# Patient Record
Sex: Male | Born: 1995 | Race: White | Hispanic: No | Marital: Single
Health system: Southern US, Community
[De-identification: ages and names within clinical notes are randomized; demographics above are authoritative.]

## PROBLEM LIST (undated history)

## (undated) DIAGNOSIS — R51 Headache: Secondary | ICD-10-CM

## (undated) HISTORY — DX: Headache: R51

---
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
## 2017-01-07 MED FILL — OMEPRAZOLE DR 40 MG CAPSULE: 40 | 30 days supply | Qty: 30 | Fill #0

## 2017-01-16 DIAGNOSIS — F419 Anxiety disorder, unspecified: Secondary | ICD-10-CM | POA: Diagnosis not present

## 2017-01-16 DIAGNOSIS — Z0000 Encounter for general adult medical examination without abnormal findings: Secondary | ICD-10-CM | POA: Diagnosis not present

## 2017-01-16 DIAGNOSIS — E559 Vitamin D deficiency, unspecified: Secondary | ICD-10-CM | POA: Diagnosis not present

## 2017-01-16 DIAGNOSIS — R5383 Other fatigue: Secondary | ICD-10-CM | POA: Diagnosis not present

## 2017-01-16 DIAGNOSIS — Z114 Encounter for screening for human immunodeficiency virus [HIV]: Secondary | ICD-10-CM | POA: Diagnosis not present

## 2017-01-16 MED FILL — hydrOXYzine HCL 25 MG TABS: 25 | 30 days supply | Qty: 30 | Fill #0

## 2017-01-30 DIAGNOSIS — E559 Vitamin D deficiency, unspecified: Secondary | ICD-10-CM | POA: Diagnosis not present

## 2017-01-30 DIAGNOSIS — F419 Anxiety disorder, unspecified: Secondary | ICD-10-CM | POA: Diagnosis not present

## 2017-02-05 MED FILL — OMEPRAZOLE DR 40 MG CAPSULE: 40 | 30 days supply | Qty: 30 | Fill #1

## 2017-03-12 MED FILL — OMEPRAZOLE DR 40 MG CAPSULE: 40 | 30 days supply | Qty: 30 | Fill #2

## 2017-04-20 MED FILL — OMEPRAZOLE DR 40 MG CAPSULE: 40 | 30 days supply | Qty: 30 | Fill #3

## 2017-04-25 DIAGNOSIS — S29019A Strain of muscle and tendon of unspecified wall of thorax, initial encounter: Secondary | ICD-10-CM | POA: Diagnosis not present

## 2017-04-25 DIAGNOSIS — M546 Pain in thoracic spine: Secondary | ICD-10-CM | POA: Diagnosis not present

## 2017-04-25 DIAGNOSIS — S161XXA Strain of muscle, fascia and tendon at neck level, initial encounter: Secondary | ICD-10-CM | POA: Diagnosis not present

## 2017-04-25 DIAGNOSIS — M542 Cervicalgia: Secondary | ICD-10-CM | POA: Diagnosis not present

## 2017-05-06 ENCOUNTER — Encounter (INDEPENDENT_AMBULATORY_CARE_PROVIDER_SITE_OTHER): Payer: Self-pay | Admitting: Neurology

## 2017-05-06 DIAGNOSIS — S29019D Strain of muscle and tendon of unspecified wall of thorax, subsequent encounter: Secondary | ICD-10-CM | POA: Diagnosis not present

## 2017-05-06 DIAGNOSIS — J018 Other acute sinusitis: Secondary | ICD-10-CM | POA: Diagnosis not present

## 2017-06-03 MED FILL — OMEPRAZOLE DR 40 MG CAPSULE: 40 | 30 days supply | Qty: 30 | Fill #4

## 2018-06-07 DIAGNOSIS — J069 Acute upper respiratory infection, unspecified: Secondary | ICD-10-CM | POA: Diagnosis not present

## 2018-06-07 DIAGNOSIS — R52 Pain, unspecified: Secondary | ICD-10-CM | POA: Diagnosis not present

## 2018-06-07 DIAGNOSIS — J029 Acute pharyngitis, unspecified: Secondary | ICD-10-CM | POA: Diagnosis not present

## 2018-09-15 DIAGNOSIS — M542 Cervicalgia: Secondary | ICD-10-CM | POA: Diagnosis not present

## 2018-09-15 DIAGNOSIS — M9902 Segmental and somatic dysfunction of thoracic region: Secondary | ICD-10-CM | POA: Diagnosis not present

## 2018-09-15 DIAGNOSIS — M545 Low back pain: Secondary | ICD-10-CM | POA: Diagnosis not present

## 2018-09-15 DIAGNOSIS — M9901 Segmental and somatic dysfunction of cervical region: Secondary | ICD-10-CM | POA: Diagnosis not present

## 2018-09-16 DIAGNOSIS — M9901 Segmental and somatic dysfunction of cervical region: Secondary | ICD-10-CM | POA: Diagnosis not present

## 2018-09-16 DIAGNOSIS — M542 Cervicalgia: Secondary | ICD-10-CM | POA: Diagnosis not present

## 2018-09-16 DIAGNOSIS — M9902 Segmental and somatic dysfunction of thoracic region: Secondary | ICD-10-CM | POA: Diagnosis not present

## 2018-09-16 DIAGNOSIS — M545 Low back pain: Secondary | ICD-10-CM | POA: Diagnosis not present

## 2018-10-01 DIAGNOSIS — Z23 Encounter for immunization: Secondary | ICD-10-CM | POA: Diagnosis not present

## 2018-10-01 DIAGNOSIS — G43109 Migraine with aura, not intractable, without status migrainosus: Secondary | ICD-10-CM | POA: Diagnosis not present

## 2018-10-01 DIAGNOSIS — F17210 Nicotine dependence, cigarettes, uncomplicated: Secondary | ICD-10-CM | POA: Diagnosis not present

## 2018-10-01 DIAGNOSIS — Z0000 Encounter for general adult medical examination without abnormal findings: Secondary | ICD-10-CM | POA: Diagnosis not present

## 2018-10-30 DIAGNOSIS — R05 Cough: Secondary | ICD-10-CM | POA: Diagnosis not present

## 2018-10-30 DIAGNOSIS — J209 Acute bronchitis, unspecified: Secondary | ICD-10-CM | POA: Diagnosis not present

## 2018-10-30 DIAGNOSIS — R6883 Chills (without fever): Secondary | ICD-10-CM | POA: Diagnosis not present

## 2018-10-30 DIAGNOSIS — J029 Acute pharyngitis, unspecified: Secondary | ICD-10-CM | POA: Diagnosis not present

## 2018-12-20 DIAGNOSIS — M545 Low back pain: Secondary | ICD-10-CM | POA: Diagnosis not present

## 2018-12-20 DIAGNOSIS — M9903 Segmental and somatic dysfunction of lumbar region: Secondary | ICD-10-CM | POA: Diagnosis not present

## 2018-12-20 DIAGNOSIS — M542 Cervicalgia: Secondary | ICD-10-CM | POA: Diagnosis not present

## 2018-12-20 DIAGNOSIS — M546 Pain in thoracic spine: Secondary | ICD-10-CM | POA: Diagnosis not present

## 2018-12-21 DIAGNOSIS — M546 Pain in thoracic spine: Secondary | ICD-10-CM | POA: Diagnosis not present

## 2018-12-21 DIAGNOSIS — M542 Cervicalgia: Secondary | ICD-10-CM | POA: Diagnosis not present

## 2018-12-21 DIAGNOSIS — M9903 Segmental and somatic dysfunction of lumbar region: Secondary | ICD-10-CM | POA: Diagnosis not present

## 2018-12-21 DIAGNOSIS — M545 Low back pain: Secondary | ICD-10-CM | POA: Diagnosis not present

## 2019-10-12 ENCOUNTER — Other Ambulatory Visit: Payer: Self-pay | Admitting: Geriatric Medicine

## 2019-10-12 ENCOUNTER — Ambulatory Visit
Admission: RE | Admit: 2019-10-12 | Discharge: 2019-10-12 | Disposition: A | Discharge status: Home / Self Care | Payer: 59 | Source: Ambulatory Visit | Attending: Geriatric Medicine | Admitting: Geriatric Medicine

## 2019-10-12 DIAGNOSIS — R0600 Dyspnea, unspecified: Secondary | ICD-10-CM | POA: Diagnosis not present

## 2019-10-12 DIAGNOSIS — Z0000 Encounter for general adult medical examination without abnormal findings: Secondary | ICD-10-CM | POA: Diagnosis not present

## 2019-10-12 DIAGNOSIS — G43109 Migraine with aura, not intractable, without status migrainosus: Secondary | ICD-10-CM | POA: Diagnosis not present

## 2019-10-12 DIAGNOSIS — R0609 Other forms of dyspnea: Secondary | ICD-10-CM | POA: Diagnosis not present

## 2019-10-12 DIAGNOSIS — D7589 Other specified diseases of blood and blood-forming organs: Secondary | ICD-10-CM | POA: Diagnosis not present

## 2019-10-12 DIAGNOSIS — Z79899 Other long term (current) drug therapy: Secondary | ICD-10-CM | POA: Diagnosis not present

## 2019-10-12 DIAGNOSIS — Z23 Encounter for immunization: Secondary | ICD-10-CM | POA: Diagnosis not present

## 2019-10-12 DIAGNOSIS — F17210 Nicotine dependence, cigarettes, uncomplicated: Secondary | ICD-10-CM | POA: Diagnosis not present

## 2019-10-12 DIAGNOSIS — Z13220 Encounter for screening for lipoid disorders: Secondary | ICD-10-CM | POA: Diagnosis not present

## 2019-10-12 DIAGNOSIS — Z789 Other specified health status: Secondary | ICD-10-CM | POA: Diagnosis not present

## 2019-11-02 DIAGNOSIS — M9902 Segmental and somatic dysfunction of thoracic region: Secondary | ICD-10-CM | POA: Diagnosis not present

## 2019-11-02 DIAGNOSIS — M546 Pain in thoracic spine: Secondary | ICD-10-CM | POA: Diagnosis not present

## 2019-11-02 DIAGNOSIS — M62838 Other muscle spasm: Secondary | ICD-10-CM | POA: Diagnosis not present

## 2019-11-03 DIAGNOSIS — M9906 Segmental and somatic dysfunction of lower extremity: Secondary | ICD-10-CM | POA: Diagnosis not present

## 2019-11-03 DIAGNOSIS — M9903 Segmental and somatic dysfunction of lumbar region: Secondary | ICD-10-CM | POA: Diagnosis not present

## 2019-11-03 DIAGNOSIS — M62838 Other muscle spasm: Secondary | ICD-10-CM | POA: Diagnosis not present

## 2019-11-03 DIAGNOSIS — M546 Pain in thoracic spine: Secondary | ICD-10-CM | POA: Diagnosis not present

## 2019-12-31 ENCOUNTER — Ambulatory Visit: Payer: 59 | Attending: Internal Medicine

## 2019-12-31 DIAGNOSIS — Z23 Encounter for immunization: Secondary | ICD-10-CM

## 2019-12-31 NOTE — Progress Notes (Signed)
   Covid-19 Vaccination Clinic  Name:  Cole [name]    MRN: [id number] DOB: May 26, 1996  12/31/2019  Mr. Cole [name] was observed post Covid-19 immunization for 15 minutes without incident. He was provided with Vaccine Information Sheet and instruction to access the V-Safe system.   Mr. Cole [name] was instructed to call 911 with any severe reactions post vaccine: Marland Kitchen Difficulty breathing  . Swelling of face and throat  . A fast heartbeat  . A bad rash all over body  . Dizziness and weakness   Immunizations Administered    Name Date Dose VIS Date Route   Pfizer COVID-19 Vaccine 12/31/2019 12:05 PM 0.3 mL 09/09/2019 Intramuscular   Manufacturer: [employer], [employer]   Lot: [id number]   NDC: [address]-2

## 2020-01-12 DIAGNOSIS — Z1152 Encounter for screening for COVID-19: Secondary | ICD-10-CM | POA: Diagnosis not present

## 2020-01-12 DIAGNOSIS — R05 Cough: Secondary | ICD-10-CM | POA: Diagnosis not present

## 2020-01-25 ENCOUNTER — Ambulatory Visit: Payer: 59 | Attending: Internal Medicine

## 2020-01-25 DIAGNOSIS — Z23 Encounter for immunization: Secondary | ICD-10-CM

## 2020-01-25 NOTE — Progress Notes (Signed)
   Covid-19 Vaccination Clinic  Name:  Cole [name]    MRN: [id number] DOB: 09-Mar-1996  01/25/2020  Cole [name] was observed post Covid-19 immunization for 15 minutes without incident. He was provided with Vaccine Information Sheet and instruction to access the V-Safe system.   Cole [name] was instructed to call 911 with any severe reactions post vaccine: Marland Kitchen Difficulty breathing  . Swelling of face and throat  . A fast heartbeat  . A bad rash all over body  . Dizziness and weakness   Immunizations Administered    Name Date Dose VIS Date Route   Pfizer COVID-19 Vaccine 01/25/2020  8:51 AM 0.3 mL 11/23/2018 Intramuscular   Manufacturer: [employer], [employer]   Lot: [id number]   NDC: [address]-9

## 2020-05-05 DIAGNOSIS — R4020 Unspecified coma: Secondary | ICD-10-CM | POA: Diagnosis not present

## 2020-05-05 DIAGNOSIS — R456 Violent behavior: Secondary | ICD-10-CM | POA: Diagnosis not present

## 2020-05-05 DIAGNOSIS — F29 Unspecified psychosis not due to a substance or known physiological condition: Secondary | ICD-10-CM | POA: Diagnosis not present

## 2020-05-05 DIAGNOSIS — F10929 Alcohol use, unspecified with intoxication, unspecified: Secondary | ICD-10-CM | POA: Diagnosis not present

## 2020-05-05 DIAGNOSIS — R000 Tachycardia, unspecified: Secondary | ICD-10-CM | POA: Diagnosis not present

## 2020-05-06 ENCOUNTER — Other Ambulatory Visit: Payer: Self-pay

## 2020-05-06 ENCOUNTER — Emergency Department (HOSPITAL_COMMUNITY)
Admission: EM | Admit: 2020-05-06 | Discharge: 2020-05-06 | Disposition: A | Discharge status: Home / Self Care | Payer: 59 | Attending: Emergency Medicine | Admitting: Emergency Medicine

## 2020-05-06 ENCOUNTER — Emergency Department (HOSPITAL_COMMUNITY)
Admission: EM | Admit: 2020-05-06 | Discharge: 2020-05-06 | Disposition: A | Discharge status: Home / Self Care | Payer: 59 | Source: Home / Self Care

## 2020-05-06 ENCOUNTER — Emergency Department (HOSPITAL_COMMUNITY): Payer: 59

## 2020-05-06 DIAGNOSIS — Y92410 Unspecified street and highway as the place of occurrence of the external cause: Secondary | ICD-10-CM | POA: Diagnosis not present

## 2020-05-06 DIAGNOSIS — W19XXXA Unspecified fall, initial encounter: Secondary | ICD-10-CM | POA: Diagnosis not present

## 2020-05-06 DIAGNOSIS — R4182 Altered mental status, unspecified: Secondary | ICD-10-CM | POA: Diagnosis not present

## 2020-05-06 DIAGNOSIS — S060X9A Concussion with loss of consciousness of unspecified duration, initial encounter: Secondary | ICD-10-CM

## 2020-05-06 DIAGNOSIS — S00211A Abrasion of right eyelid and periocular area, initial encounter: Secondary | ICD-10-CM | POA: Insufficient documentation

## 2020-05-06 DIAGNOSIS — Y999 Unspecified external cause status: Secondary | ICD-10-CM | POA: Insufficient documentation

## 2020-05-06 DIAGNOSIS — F10120 Alcohol abuse with intoxication, uncomplicated: Secondary | ICD-10-CM | POA: Diagnosis not present

## 2020-05-06 DIAGNOSIS — Y939 Activity, unspecified: Secondary | ICD-10-CM | POA: Insufficient documentation

## 2020-05-06 DIAGNOSIS — F10920 Alcohol use, unspecified with intoxication, uncomplicated: Secondary | ICD-10-CM

## 2020-05-06 DIAGNOSIS — S060X1A Concussion with loss of consciousness of 30 minutes or less, initial encounter: Secondary | ICD-10-CM | POA: Insufficient documentation

## 2020-05-06 DIAGNOSIS — F10129 Alcohol abuse with intoxication, unspecified: Secondary | ICD-10-CM | POA: Insufficient documentation

## 2020-05-06 LAB — ETHANOL: Alcohol, Ethyl (B): 336 mg/dL (ref <10)

## 2020-05-06 LAB — RAPID URINE DRUG SCREEN, HOSP PERFORMED
Amphetamines: NOT DETECTED
Barbiturates: NOT DETECTED
Benzodiazepines: NOT DETECTED
Cocaine: NOT DETECTED
Opiates: NOT DETECTED
Tetrahydrocannabinol: POSITIVE — AB

## 2020-05-06 LAB — CBC
HCT: 46.1 % (ref 39.0–52.0)
Hemoglobin: 15.4 g/dL (ref 13.0–17.0)
MCH: 32.1 pg (ref 26.0–34.0)
MCHC: 33.4 g/dL (ref 30.0–36.0)
MCV: 96 fL (ref 80.0–100.0)
Platelets: 298 10*3/uL (ref 150–400)
RBC: 4.8 MIL/uL (ref 4.22–5.81)
RDW: 11.7 % (ref 11.5–15.5)
WBC: 5.8 10*3/uL (ref 4.0–10.5)
nRBC: 0 % (ref 0.0–0.2)

## 2020-05-06 LAB — COMPREHENSIVE METABOLIC PANEL
ALT: 16 U/L (ref 0–44)
AST: 22 U/L (ref 15–41)
Albumin: 4.4 g/dL (ref 3.5–5.0)
Alkaline Phosphatase: 49 U/L (ref 38–126)
Anion gap: 11 (ref 5–15)
BUN: 11 mg/dL (ref 6–20)
CO2: 25 mmol/L (ref 22–32)
Calcium: 9 mg/dL (ref 8.9–10.3)
Chloride: 107 mmol/L (ref 98–111)
Creatinine, Ser: 0.84 mg/dL (ref 0.61–1.24)
GFR calc Af Amer: >60 mL/min (ref >60)
GFR calc non Af Amer: >60 mL/min (ref >60)
Glucose, Bld: 109 mg/dL — ABNORMAL HIGH (ref 70–99)
Potassium: 3.9 mmol/L (ref 3.5–5.1)
Sodium: 143 mmol/L (ref 135–145)
Total Bilirubin: 0.6 mg/dL (ref 0.3–1.2)
Total Protein: 7 g/dL (ref 6.5–8.1)

## 2020-05-06 MED ORDER — TETANUS-DIPHTH-ACELL PERTUSSIS 5-2.5-18.5 LF-MCG/0.5 IM SUSP: 0.5000 mL | Freq: Once | INTRAMUSCULAR | Status: DC

## 2020-05-06 NOTE — ED Provider Notes (Signed)
[facility] EMERGENCY DEPARTMENT Provider Note   CSN: [id number] Arrival date & time: 05/06/20  0028     History Chief Complaint  Patient presents with  . Fall   Level 5 caveat due to acuity of condition Cole [name] is a 24 y.o. male.  The history is provided by the patient and the EMS personnel. The history is limited by the condition of the patient.  Fall This is a new problem. The problem occurs constantly. Nothing aggravates the symptoms. Nothing relieves the symptoms.  Patient presents via EMS.  Patient was found near a bar lying on the ground.  EMS was called patient stood up and fell over.  He admits to heavy alcohol use.  No other details known arrival     PMH-unknown Soc hx - unknown Social History   Tobacco Use  . Smoking status: Not on file  Substance Use Topics  . Alcohol use: Not on file  . Drug use: Not on file    Home Medications Prior to Admission medications   Not on File    Allergies    Patient has no allergy information on record.  Review of Systems   Review of Systems  Unable to perform ROS: Acuity of condition    Physical Exam Updated Vital Signs BP 112/79 (BP Location: Left Arm)   Pulse 88   Resp (!) 22   Ht 1.676 m (5\' 6" )   Wt 54.4 kg   SpO2 99%   BMI 19.37 kg/m   Physical Exam CONSTITUTIONAL: Disheveled and anxious HEAD: Small abrasion above right eyebrow.  No other signs of trauma EYES: EOMI/PERRL ENMT: Mucous membranes moist, no visible trauma NECK: Patient refuses cervical collar SPINE/BACK:entire spine nontender, no bruising/crepitance/stepoffs noted to spine CV: S1/S2 noted, no murmurs/rubs/gallops noted LUNGS: Lungs are clear to auscultation bilaterally, no apparent distress ABDOMEN: soft, nontender, no rebound or guarding, bowel sounds noted throughout abdomen GU:no cva tenderness NEURO: Pt is awake/alert moves all extremitiesx4.  No facial droop.  Patient is intermittently combative and  agitated EXTREMITIES: pulses normal/equal, full ROM, pelvis stable, all other extremities/joints palpated/ranged and nontender SKIN: warm, color normal PSYCH: Anxious   ED Results / Procedures / Treatments   Labs (all labs ordered are listed, but only abnormal results are displayed) Labs Reviewed  COMPREHENSIVE METABOLIC PANEL - Abnormal; Notable for the following components:      Result Value   Glucose, Bld 109 (*)    All other components within normal limits  ETHANOL - Abnormal; Notable for the following components:   Alcohol, Ethyl (B) 336 (*)    All other components within normal limits  RAPID URINE DRUG SCREEN, HOSP PERFORMED - Abnormal; Notable for the following components:   Tetrahydrocannabinol POSITIVE (*)    All other components within normal limits  CBC    EKG None  Radiology CT HEAD WO CONTRAST  Result Date: 05/06/2020 CLINICAL DATA:  Change in mental status EXAM: CT HEAD WITHOUT CONTRAST TECHNIQUE: Contiguous axial images were obtained from the base of the skull through the vertex without intravenous contrast. COMPARISON:  None. FINDINGS: Brain: No evidence of acute territorial infarction, hemorrhage, hydrocephalus,extra-axial collection or mass lesion/mass effect. Normal gray-white differentiation. Ventricles are normal in size and contour. Vascular: No hyperdense vessel or unexpected calcification. Skull: The skull is intact. No fracture or focal lesion identified. Sinuses/Orbits: Ethmoid air cell mucosal thickening is seen. Mastoid air cells are clear. The orbits and globes intact. Other: None Cervical spine: Alignment: Physiologic Skull base and  vertebrae: Visualized skull base is intact. No atlanto-occipital dissociation. The vertebral body heights are well maintained. No fracture or pathologic osseous lesion seen. Soft tissues and spinal canal: The visualized paraspinal soft tissues are unremarkable. No prevertebral soft tissue swelling is seen. The spinal canal is  grossly unremarkable, no large epidural collection or significant canal narrowing. Disc levels:  No significant canal or neural foraminal narrowing. Upper chest: The lung apices are clear. Thoracic inlet is within normal limits. Other: None IMPRESSION: No acute intracranial abnormality. No acute fracture or malalignment of the spine. Electronically Signed   By: Jonna Clark M.D.   On: 05/06/2020 01:11   CT CERVICAL SPINE WO CONTRAST  Result Date: 05/06/2020 CLINICAL DATA:  Change in mental status EXAM: CT HEAD WITHOUT CONTRAST TECHNIQUE: Contiguous axial images were obtained from the base of the skull through the vertex without intravenous contrast. COMPARISON:  None. FINDINGS: Brain: No evidence of acute territorial infarction, hemorrhage, hydrocephalus,extra-axial collection or mass lesion/mass effect. Normal gray-white differentiation. Ventricles are normal in size and contour. Vascular: No hyperdense vessel or unexpected calcification. Skull: The skull is intact. No fracture or focal lesion identified. Sinuses/Orbits: Ethmoid air cell mucosal thickening is seen. Mastoid air cells are clear. The orbits and globes intact. Other: None Cervical spine: Alignment: Physiologic Skull base and vertebrae: Visualized skull base is intact. No atlanto-occipital dissociation. The vertebral body heights are well maintained. No fracture or pathologic osseous lesion seen. Soft tissues and spinal canal: The visualized paraspinal soft tissues are unremarkable. No prevertebral soft tissue swelling is seen. The spinal canal is grossly unremarkable, no large epidural collection or significant canal narrowing. Disc levels:  No significant canal or neural foraminal narrowing. Upper chest: The lung apices are clear. Thoracic inlet is within normal limits. Other: None IMPRESSION: No acute intracranial abnormality. No acute fracture or malalignment of the spine. Electronically Signed   By: Jonna Clark M.D.   On: 05/06/2020 01:11     Procedures Procedures   Medications Ordered in ED Medications  Tdap (BOOSTRIX) injection 0.5 mL (has no administration in time range)    ED Course  I have reviewed the triage vital signs and the nursing notes.  Pertinent labs & imaging results that were available during my care of the patient were reviewed by me and considered in my medical decision making (see chart for details).    MDM Rules/Calculators/A&P                          12:38 AM Patient is to present as a level 2 trauma after a fall and tachycardia.  His heart rate is improving.  I downgraded him to a nontrauma alert  patient is clearly intoxicated with intermittent agitation.  Small abrasion noted above right eyebrow.  Due to intoxication and signs of head injury, CT head ordered.  I am unable to clear his cervical spine, will proceed with CT C-spine but he refuses to wear a cervical collar and it is difficult to keep him still in the bed. 1:50 AM Imaging negative.  Mother is now at bedside.  Patient is requesting something to drink 2:46 AM Patient ambulatory.  Labs confirm alcohol intoxication.  No other acute signs of traumatic injury.  Patient is ambulatory and awake and alert.  Mother can take patient home.  Final Clinical Impression(s) / ED Diagnoses Final diagnoses:  Fall, initial encounter  Concussion with loss of consciousness, initial encounter  Alcoholic intoxication without complication (HCC)  Rx / DC Orders ED Discharge Orders    None       Zadie Rhine, MD 05/06/20 [phone]

## 2020-05-06 NOTE — ED Triage Notes (Signed)
Patient BIB [facility]. EMS reports that the patient was found across the street from a bar by the bar staff unresponsive. EMS reports that when the bar staff tried to approach him, he began to run and then fell. EMS reports no LOC. EMS reported that patient was slight tachycardic while in transport, with patients HR ranging between 110-120. Laceration noted to the right eyebrow. EMS also reports combativeness, patient denied a c-collar, and for EMS staff to begin a line.  Upon admission, patient reported that he "smoked a lot of marijuana" and that "it would be seen in his urine. Patient combative upon arrival.  EMS vitals 128/60 100% RA 20 RR 97.7

## 2020-05-09 DIAGNOSIS — Z20822 Contact with and (suspected) exposure to covid-19: Secondary | ICD-10-CM | POA: Diagnosis not present

## 2020-08-29 DIAGNOSIS — R5383 Other fatigue: Secondary | ICD-10-CM | POA: Diagnosis not present

## 2020-08-29 DIAGNOSIS — Z20822 Contact with and (suspected) exposure to covid-19: Secondary | ICD-10-CM | POA: Diagnosis not present

## 2020-09-07 DIAGNOSIS — Z20822 Contact with and (suspected) exposure to covid-19: Secondary | ICD-10-CM | POA: Diagnosis not present

## 2020-11-28 DIAGNOSIS — Z79899 Other long term (current) drug therapy: Secondary | ICD-10-CM | POA: Diagnosis not present

## 2020-11-28 DIAGNOSIS — Z0000 Encounter for general adult medical examination without abnormal findings: Secondary | ICD-10-CM | POA: Diagnosis not present

## 2021-08-26 DIAGNOSIS — Z03818 Encounter for observation for suspected exposure to other biological agents ruled out: Secondary | ICD-10-CM | POA: Diagnosis not present

## 2021-08-26 DIAGNOSIS — J069 Acute upper respiratory infection, unspecified: Secondary | ICD-10-CM | POA: Diagnosis not present

## 2021-11-28 IMAGING — CT CT HEAD W/O CM
3 of 5 series · 14 of 47 positions shown, 16 images · non-contrast
Comparison: None.

CLINICAL DATA: Change in mental status

EXAM:
CT HEAD WITHOUT CONTRAST
TECHNIQUE: Contiguous axial images were obtained from the base of the skull
through the vertex without intravenous contrast.

[Series 5: head 2.0 h70h · axial · 0.56mm/px · z∈[-125,+17]mm · 8 of 83 slices shown, 10 images]
[im 6/83  brain]
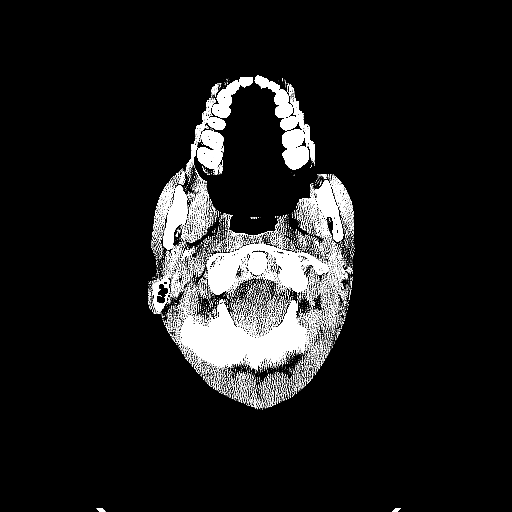
[im 6/83  bone]
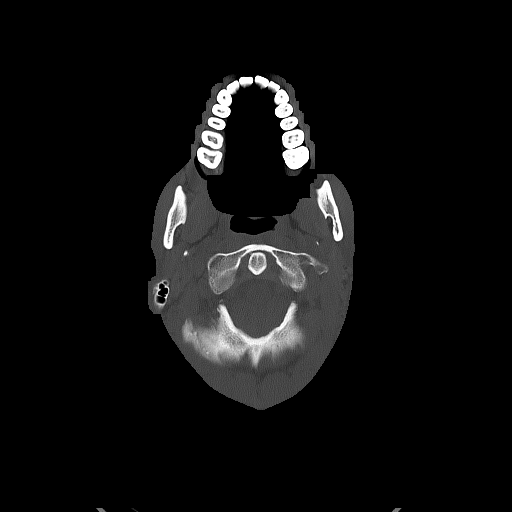
[im 16/83  brain]
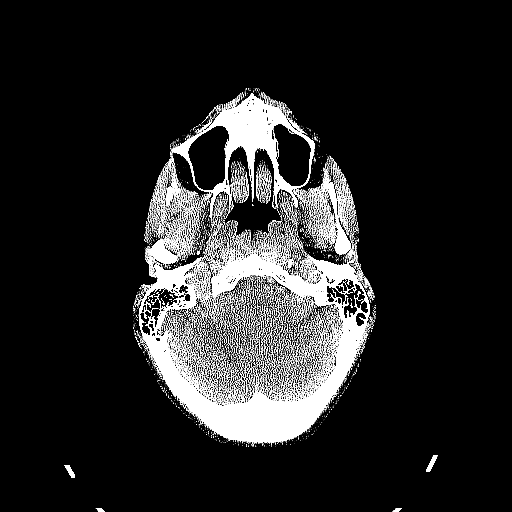
[im 26/83  brain]
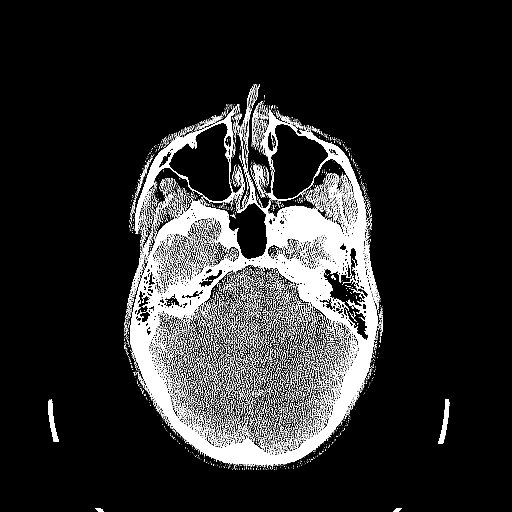
[im 36/83  brain]
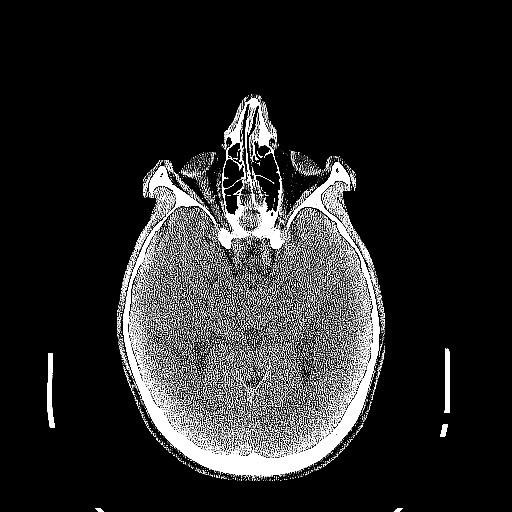
[im 47/83  brain]
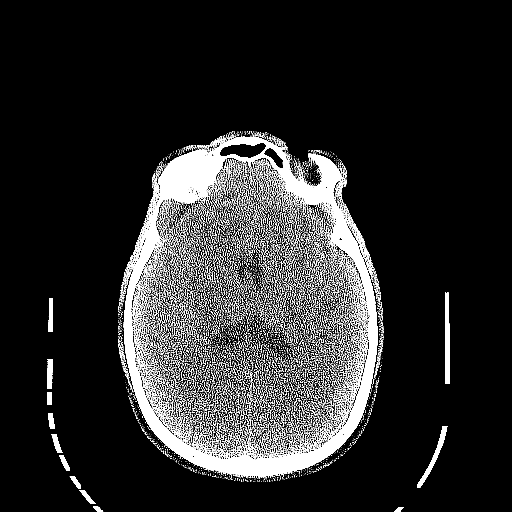
[im 47/83  bone]
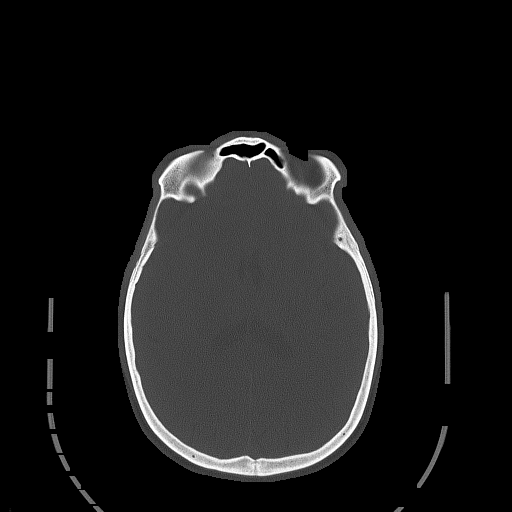
[im 57/83  brain]
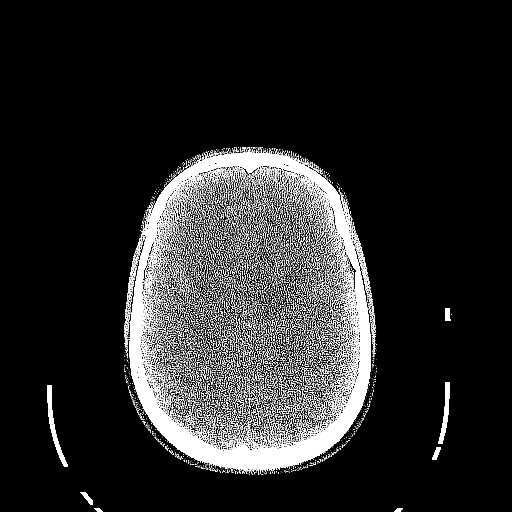
[im 67/83  brain]
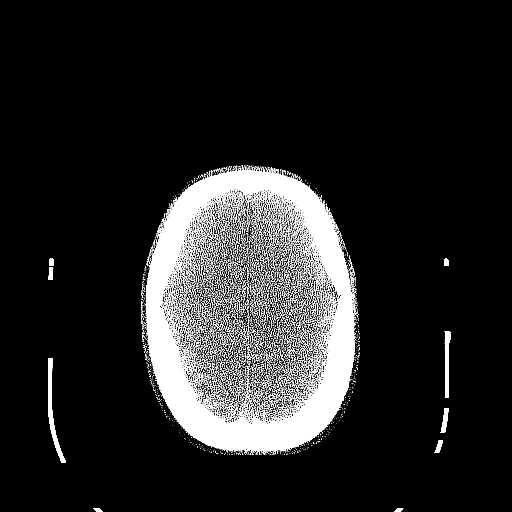
[im 77/83  brain]
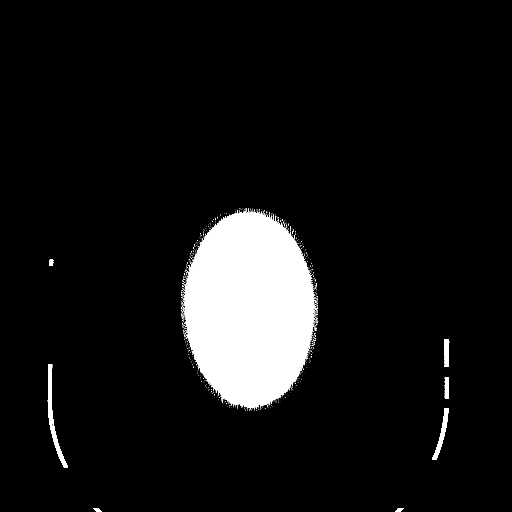

[Series 6: head 3.0 mpr cor · coronal · 0.32mm/px · 3 of 86 slices shown]
[im 29/86  brain]
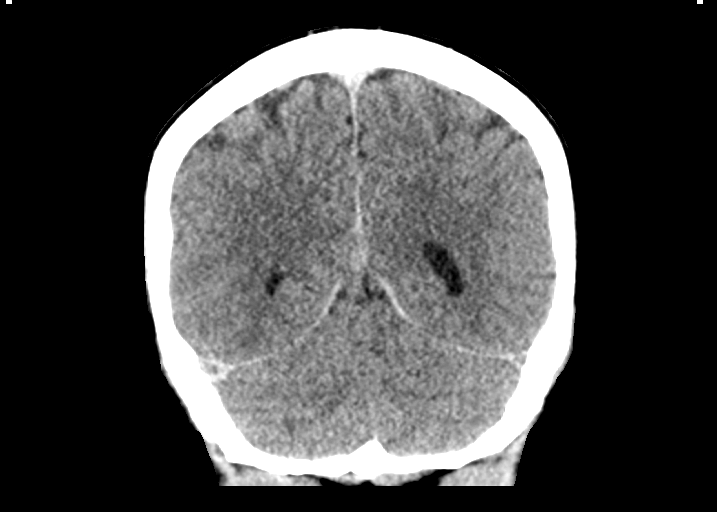
[im 38/86  brain]
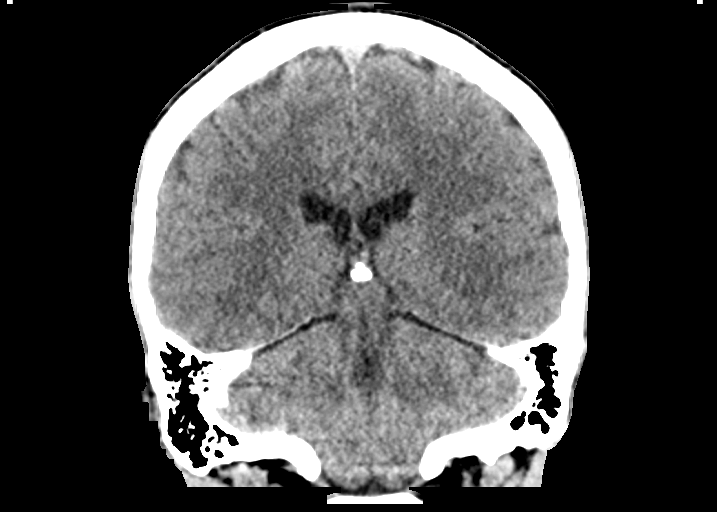
[im 48/86  brain]
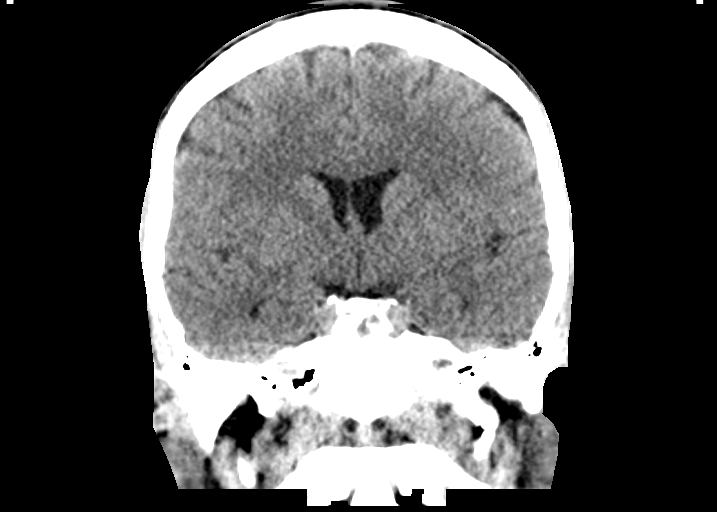

[Series 7: head 3.0 mpr sag · sagittal · 0.32mm/px · 3 of 62 slices shown]
[im 21/62  brain]
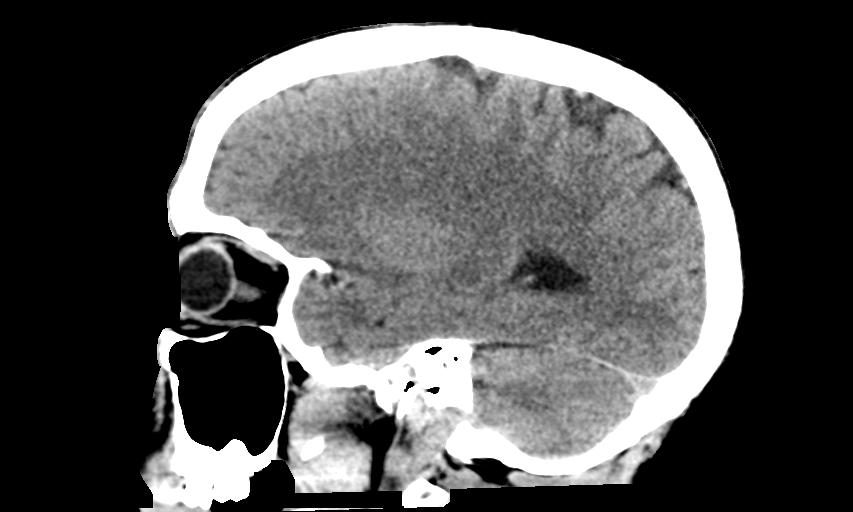
[im 31/62  brain]
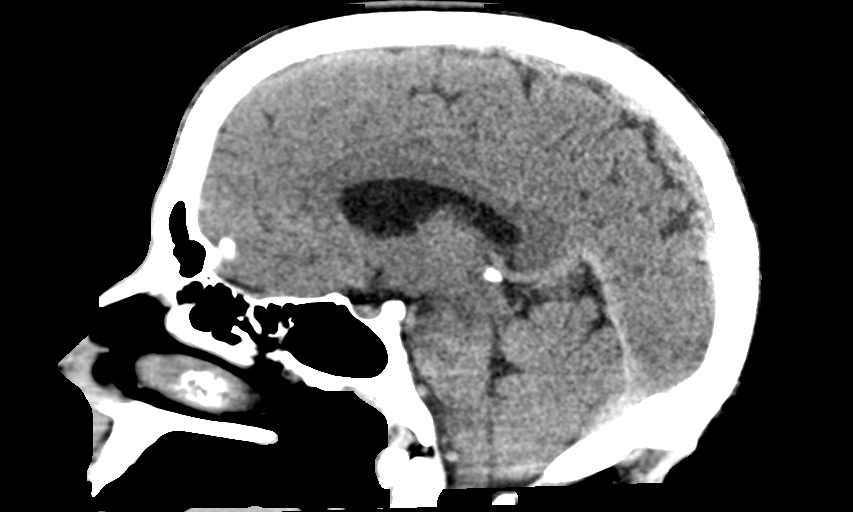
[im 41/62  brain]
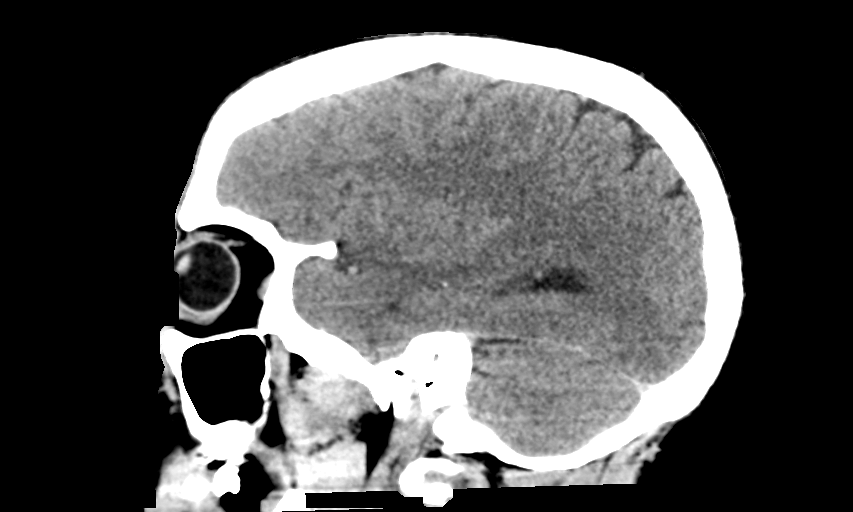

[14 of 47 positions shown; findings below may reference images not displayed]

FINDINGS: Brain: No evidence of acute territorial infarction, hemorrhage,
hydrocephalus,extra-axial collection or mass lesion/mass effect.
Normal gray-white differentiation. Ventricles are normal in size and
contour.

Vascular: No hyperdense vessel or unexpected calcification.

Skull: The skull is intact. No fracture or focal lesion identified.

Sinuses/Orbits: Ethmoid air cell mucosal thickening is seen. Mastoid
air cells are clear. The orbits and globes intact.

Other: None

Cervical spine:

Alignment: Physiologic

Skull base and vertebrae: Visualized skull base is intact. No
atlanto-occipital dissociation. The vertebral body heights are well
maintained. No fracture or pathologic osseous lesion seen.

Soft tissues and spinal canal: The visualized paraspinal soft
tissues are unremarkable. No prevertebral soft tissue swelling is
seen. The spinal canal is grossly unremarkable, no large epidural
collection or significant canal narrowing.

Disc levels:  No significant canal or neural foraminal narrowing.

Upper chest: The lung apices are clear. Thoracic inlet is within
normal limits.

Other: None
IMPRESSION: No acute intracranial abnormality.

No acute fracture or malalignment of the spine.

## 2022-01-13 DIAGNOSIS — Z13220 Encounter for screening for lipoid disorders: Secondary | ICD-10-CM | POA: Diagnosis not present

## 2022-01-13 DIAGNOSIS — Z0000 Encounter for general adult medical examination without abnormal findings: Secondary | ICD-10-CM | POA: Diagnosis not present

## 2022-01-13 DIAGNOSIS — Z1159 Encounter for screening for other viral diseases: Secondary | ICD-10-CM | POA: Diagnosis not present

## 2022-01-13 DIAGNOSIS — G43109 Migraine with aura, not intractable, without status migrainosus: Secondary | ICD-10-CM | POA: Diagnosis not present

## 2022-01-13 DIAGNOSIS — Z79899 Other long term (current) drug therapy: Secondary | ICD-10-CM | POA: Diagnosis not present

## 2022-07-09 DIAGNOSIS — J329 Chronic sinusitis, unspecified: Secondary | ICD-10-CM | POA: Diagnosis not present

## 2022-07-09 DIAGNOSIS — G4483 Primary cough headache: Secondary | ICD-10-CM | POA: Diagnosis not present

## 2022-07-10 DIAGNOSIS — M9902 Segmental and somatic dysfunction of thoracic region: Secondary | ICD-10-CM | POA: Diagnosis not present

## 2022-07-10 DIAGNOSIS — M9901 Segmental and somatic dysfunction of cervical region: Secondary | ICD-10-CM | POA: Diagnosis not present

## 2022-07-10 DIAGNOSIS — M9903 Segmental and somatic dysfunction of lumbar region: Secondary | ICD-10-CM | POA: Diagnosis not present

## 2022-07-10 DIAGNOSIS — M9904 Segmental and somatic dysfunction of sacral region: Secondary | ICD-10-CM | POA: Diagnosis not present

## 2022-07-14 DIAGNOSIS — M9903 Segmental and somatic dysfunction of lumbar region: Secondary | ICD-10-CM | POA: Diagnosis not present

## 2022-07-14 DIAGNOSIS — M9901 Segmental and somatic dysfunction of cervical region: Secondary | ICD-10-CM | POA: Diagnosis not present

## 2022-07-14 DIAGNOSIS — M9904 Segmental and somatic dysfunction of sacral region: Secondary | ICD-10-CM | POA: Diagnosis not present

## 2022-07-14 DIAGNOSIS — M9902 Segmental and somatic dysfunction of thoracic region: Secondary | ICD-10-CM | POA: Diagnosis not present

## 2022-07-16 DIAGNOSIS — I959 Hypotension, unspecified: Secondary | ICD-10-CM | POA: Diagnosis not present

## 2022-07-16 DIAGNOSIS — M545 Low back pain, unspecified: Secondary | ICD-10-CM | POA: Diagnosis not present

## 2022-07-16 DIAGNOSIS — B2700 Gammaherpesviral mononucleosis without complication: Secondary | ICD-10-CM | POA: Diagnosis not present

## 2022-07-16 DIAGNOSIS — R3989 Other symptoms and signs involving the genitourinary system: Secondary | ICD-10-CM | POA: Diagnosis not present

## 2022-07-16 DIAGNOSIS — D72820 Lymphocytosis (symptomatic): Secondary | ICD-10-CM | POA: Diagnosis not present

## 2022-07-16 DIAGNOSIS — J320 Chronic maxillary sinusitis: Secondary | ICD-10-CM | POA: Diagnosis not present

## 2023-01-23 DIAGNOSIS — J22 Unspecified acute lower respiratory infection: Secondary | ICD-10-CM | POA: Diagnosis not present

## 2023-01-23 DIAGNOSIS — J9801 Acute bronchospasm: Secondary | ICD-10-CM | POA: Diagnosis not present

## 2023-04-07 DIAGNOSIS — R109 Unspecified abdominal pain: Secondary | ICD-10-CM | POA: Diagnosis not present

## 2023-04-07 DIAGNOSIS — R350 Frequency of micturition: Secondary | ICD-10-CM | POA: Diagnosis not present

## 2023-06-08 ENCOUNTER — Other Ambulatory Visit: Payer: Self-pay | Admitting: Internal Medicine

## 2023-06-08 DIAGNOSIS — K920 Hematemesis: Secondary | ICD-10-CM | POA: Diagnosis not present

## 2023-06-08 DIAGNOSIS — R109 Unspecified abdominal pain: Secondary | ICD-10-CM

## 2023-06-08 DIAGNOSIS — R112 Nausea with vomiting, unspecified: Secondary | ICD-10-CM | POA: Diagnosis not present

## 2023-06-08 DIAGNOSIS — R197 Diarrhea, unspecified: Secondary | ICD-10-CM | POA: Diagnosis not present

## 2023-06-09 ENCOUNTER — Ambulatory Visit
Admission: RE | Admit: 2023-06-09 | Discharge: 2023-06-09 | Disposition: A | Discharge status: Home / Self Care | Payer: Commercial Managed Care - PPO | Source: Ambulatory Visit | Attending: Internal Medicine | Admitting: Internal Medicine

## 2023-06-09 DIAGNOSIS — R109 Unspecified abdominal pain: Secondary | ICD-10-CM

## 2023-06-09 DIAGNOSIS — R112 Nausea with vomiting, unspecified: Secondary | ICD-10-CM

## 2023-06-09 DIAGNOSIS — R1011 Right upper quadrant pain: Secondary | ICD-10-CM | POA: Diagnosis not present

## 2023-07-05 DIAGNOSIS — R112 Nausea with vomiting, unspecified: Secondary | ICD-10-CM | POA: Diagnosis not present

## 2023-07-05 DIAGNOSIS — R1013 Epigastric pain: Secondary | ICD-10-CM | POA: Diagnosis not present

## 2023-07-05 DIAGNOSIS — R079 Chest pain, unspecified: Secondary | ICD-10-CM | POA: Diagnosis not present

## 2023-08-12 ENCOUNTER — Ambulatory Visit
Admission: RE | Admit: 2023-08-12 | Discharge: 2023-08-12 | Disposition: A | Discharge status: Home / Self Care | Payer: Commercial Managed Care - PPO | Source: Ambulatory Visit | Attending: Internal Medicine | Admitting: Internal Medicine

## 2023-08-12 ENCOUNTER — Other Ambulatory Visit: Payer: Self-pay | Admitting: Internal Medicine

## 2023-08-12 DIAGNOSIS — G43109 Migraine with aura, not intractable, without status migrainosus: Secondary | ICD-10-CM | POA: Diagnosis not present

## 2023-08-12 DIAGNOSIS — M79641 Pain in right hand: Secondary | ICD-10-CM

## 2023-08-12 DIAGNOSIS — R197 Diarrhea, unspecified: Secondary | ICD-10-CM | POA: Diagnosis not present

## 2023-08-12 DIAGNOSIS — Z0000 Encounter for general adult medical examination without abnormal findings: Secondary | ICD-10-CM | POA: Diagnosis not present

## 2023-08-12 DIAGNOSIS — R109 Unspecified abdominal pain: Secondary | ICD-10-CM | POA: Diagnosis not present

## 2023-08-12 DIAGNOSIS — R112 Nausea with vomiting, unspecified: Secondary | ICD-10-CM | POA: Diagnosis not present

## 2023-08-12 DIAGNOSIS — K920 Hematemesis: Secondary | ICD-10-CM | POA: Diagnosis not present

## 2023-08-12 DIAGNOSIS — R945 Abnormal results of liver function studies: Secondary | ICD-10-CM | POA: Diagnosis not present

## 2023-09-01 DIAGNOSIS — R194 Change in bowel habit: Secondary | ICD-10-CM | POA: Diagnosis not present

## 2023-09-01 DIAGNOSIS — K219 Gastro-esophageal reflux disease without esophagitis: Secondary | ICD-10-CM | POA: Diagnosis not present

## 2023-09-01 DIAGNOSIS — K920 Hematemesis: Secondary | ICD-10-CM | POA: Diagnosis not present

## 2023-11-03 DIAGNOSIS — K2100 Gastro-esophageal reflux disease with esophagitis, without bleeding: Secondary | ICD-10-CM | POA: Diagnosis not present

## 2023-11-03 DIAGNOSIS — K920 Hematemesis: Secondary | ICD-10-CM | POA: Diagnosis not present

## 2023-11-03 DIAGNOSIS — K2970 Gastritis, unspecified, without bleeding: Secondary | ICD-10-CM | POA: Diagnosis not present

## 2023-11-03 DIAGNOSIS — K219 Gastro-esophageal reflux disease without esophagitis: Secondary | ICD-10-CM | POA: Diagnosis not present
